# Patient Record
Sex: Female | Born: 1980 | Hispanic: Yes | Marital: Married | Smoking: Never smoker
Health system: Southern US, Community
[De-identification: ages and names within clinical notes are randomized; demographics above are authoritative.]

## PROBLEM LIST (undated history)

## (undated) HISTORY — PX: TUBAL LIGATION: SHX77

---

## 2009-01-13 ENCOUNTER — Emergency Department: Payer: Self-pay | Admitting: Emergency Medicine

## 2009-04-18 ENCOUNTER — Ambulatory Visit: Payer: Self-pay | Admitting: Internal Medicine

## 2009-05-01 ENCOUNTER — Ambulatory Visit: Payer: Self-pay | Admitting: Internal Medicine

## 2018-07-19 ENCOUNTER — Emergency Department: Payer: Self-pay

## 2018-07-19 ENCOUNTER — Other Ambulatory Visit: Payer: Self-pay

## 2018-07-19 ENCOUNTER — Encounter: Payer: Self-pay | Admitting: Emergency Medicine

## 2018-07-19 ENCOUNTER — Emergency Department
Admission: EM | Admit: 2018-07-19 | Discharge: 2018-07-19 | Disposition: A | Discharge status: Home / Self Care | Payer: Self-pay | Attending: Emergency Medicine | Admitting: Emergency Medicine

## 2018-07-19 DIAGNOSIS — R109 Unspecified abdominal pain: Secondary | ICD-10-CM

## 2018-07-19 DIAGNOSIS — D219 Benign neoplasm of connective and other soft tissue, unspecified: Secondary | ICD-10-CM

## 2018-07-19 DIAGNOSIS — R1030 Lower abdominal pain, unspecified: Secondary | ICD-10-CM | POA: Insufficient documentation

## 2018-07-19 LAB — URINALYSIS, COMPLETE (UACMP) WITH MICROSCOPIC
BACTERIA UA: NONE SEEN
BILIRUBIN URINE: NEGATIVE
Glucose, UA: NEGATIVE mg/dL
KETONES UR: NEGATIVE mg/dL
LEUKOCYTES UA: NEGATIVE
Nitrite: NEGATIVE
PROTEIN: 100 mg/dL — AB
Specific Gravity, Urine: 1.018 (ref 1.005–1.030)
pH: 6 (ref 5.0–8.0)

## 2018-07-19 LAB — CBC
HEMATOCRIT: 37.4 % (ref 36.0–46.0)
Hemoglobin: 12.5 g/dL (ref 12.0–15.0)
MCH: 25.7 pg — AB (ref 26.0–34.0)
MCHC: 33.4 g/dL (ref 30.0–36.0)
MCV: 77 fL — AB (ref 80.0–100.0)
Platelets: 284 10*3/uL (ref 150–400)
RBC: 4.86 MIL/uL (ref 3.87–5.11)
RDW: 13.5 % (ref 11.5–15.5)
WBC: 9.5 10*3/uL (ref 4.0–10.5)
nRBC: 0 % (ref 0.0–0.2)

## 2018-07-19 LAB — COMPREHENSIVE METABOLIC PANEL
ALT: 59 U/L — AB (ref 0–44)
AST: 45 U/L — AB (ref 15–41)
Albumin: 4.3 g/dL (ref 3.5–5.0)
Alkaline Phosphatase: 83 U/L (ref 38–126)
Anion gap: 7 (ref 5–15)
BUN: 7 mg/dL (ref 6–20)
CHLORIDE: 104 mmol/L (ref 98–111)
CO2: 27 mmol/L (ref 22–32)
CREATININE: 0.4 mg/dL — AB (ref 0.44–1.00)
Calcium: 9.1 mg/dL (ref 8.9–10.3)
GFR calc Af Amer: >60 mL/min (ref >60)
GFR calc non Af Amer: >60 mL/min (ref >60)
GLUCOSE: 110 mg/dL — AB (ref 70–99)
Potassium: 3.8 mmol/L (ref 3.5–5.1)
SODIUM: 138 mmol/L (ref 135–145)
Total Bilirubin: 0.4 mg/dL (ref 0.3–1.2)
Total Protein: 7.4 g/dL (ref 6.5–8.1)

## 2018-07-19 LAB — POCT PREGNANCY, URINE: PREG TEST UR: NEGATIVE

## 2018-07-19 MED ORDER — OXYCODONE-ACETAMINOPHEN 5-325 MG PO TABS
1.0000 | ORAL_TABLET | ORAL | Status: DC | PRN
  Administered 2018-07-19: 1 via ORAL
  Filled 2018-07-19: qty 1

## 2018-07-19 MED ORDER — KETOROLAC TROMETHAMINE 30 MG/ML IJ SOLN
30.0000 mg | Freq: Once | INTRAMUSCULAR | Status: AC
  Administered 2018-07-19: 30 mg via INTRAMUSCULAR
  Filled 2018-07-19: qty 1

## 2018-07-19 NOTE — ED Provider Notes (Signed)
[facility] Emergency Department Provider Note  ____________________________________________   I have reviewed the triage vital signs and the nursing notes. Where available I have reviewed prior notes and, if possible and indicated, outside hospital notes.    HISTORY  Chief Complaint Abdominal Pain    HPI Carla Davenport is a 37 y.o. female  who presents today complaining of abdominal pain. Patient has had abdominal pain for 5 years. Worse with her menstrual period. She states she did see Dr. Oletta Darter about this and they one unable to tell her why she hasn't. She also complains of constipation. She denies any fever or chills. She is not sexually active she states she has no vaginal discharge. She has heavy menstrual periods, and when she has her periods are very painful. Not is why she is here. She's taken nothing for the pain except for Tylenol home. Nothing makes it better except for getting off her. Nothing makes it worse except for having her period. No fever no vomiting no diarrhea no other associated symptoms. She is not lightheaded. No trauma. Dysuria no urinary frequency. crampy lower abdominal tenderness with no radiation  History reviewed. No pertinent past medical history.  There are no active problems to display for this patient.   Past Surgical History:  Procedure Laterality Date  . CESAREAN SECTION    . TUBAL LIGATION      Prior to Admission medications   Not on File    Allergies Patient has no known allergies.  No family history on file.  Social History Social History   Tobacco Use  . Smoking status: Never Smoker  . Smokeless tobacco: Never Used  Substance Use Topics  . Alcohol use: Not Currently  . Drug use: Not on file    Review of Systems Constitutional: No fever/chills Eyes: No visual changes. ENT: No sore throat. No stiff neck no neck pain Cardiovascular: Denies chest pain. Respiratory: Denies shortness of  breath. Gastrointestinal:   no vomiting.  No diarrhea.  No constipation. Genitourinary: Negative for dysuria. Musculoskeletal: Negative lower extremity swelling Skin: Negative for rash. Neurological: Negative for severe headaches, focal weakness or numbness.   ____________________________________________   PHYSICAL EXAM:  VITAL SIGNS: ED Triage Vitals  Enc Vitals Group     BP 07/19/18 1507 (!) 169/87     Pulse Rate 07/19/18 1507 (!) 101     Resp 07/19/18 1507 18     Temp 07/19/18 1507 98.2 F (36.8 C)     Temp Source 07/19/18 1507 Oral     SpO2 07/19/18 1507 99 %     Weight 07/19/18 1513 190 lb (86.2 kg)     Height 07/19/18 1513 5\' 8"  (1.727 m)     Head Circumference --      Peak Flow --      Pain Score 07/19/18 1512 10     Pain Loc --      Pain Edu? --      Excl. in [place]? --     Constitutional: Alert and oriented. Well appearing and in no acute distress. Eyes: Conjunctivae are normal Head: Atraumatic HEENT: No congestion/rhinnorhea. Mucous membranes are moist.  Oropharynx non-erythematous Neck:   Nontender with no meningismus, no masses, no stridor Cardiovascular: Normal rate, regular rhythm. Grossly normal heart sounds.  Good peripheral circulation. Respiratory: Normal respiratory effort.  No retractions. Lungs CTAB. Abdominal: Soft and suprapubic tenderness noted. No distention. No guarding no rebound Back:  There is no focal tenderness or step off.  there  is no midline tenderness there are no lesions noted. there is no CVA tenderness  patient declines pelvic exam preferring to have ultrasound, understands limitations of declining Musculoskeletal: No lower extremity tenderness, no upper extremity tenderness. No joint effusions, no DVT signs strong distal pulses no edema Neurologic:  Normal speech and language. No gross focal neurologic deficits are appreciated.  Skin:  Skin is warm, dry and intact. No rash noted. Psychiatric: Mood and affect are normal. Speech and  behavior are normal.  ____________________________________________   LABS (all labs ordered are listed, but only abnormal results are displayed)  Labs Reviewed  COMPREHENSIVE METABOLIC PANEL - Abnormal; Notable for the following components:      Result Value   Glucose, Bld 110 (*)    Creatinine, Ser 0.40 (*)    AST 45 (*)    ALT 59 (*)    All other components within normal limits  CBC - Abnormal; Notable for the following components:   MCV 77.0 (*)    MCH 25.7 (*)    All other components within normal limits  URINALYSIS, COMPLETE (UACMP) WITH MICROSCOPIC - Abnormal; Notable for the following components:   Color, Urine YELLOW (*)    APPearance CLEAR (*)    Hgb urine dipstick LARGE (*)    Protein, ur 100 (*)    RBC / HPF >50 (*)    All other components within normal limits  POC URINE PREG, ED  POCT PREGNANCY, URINE    Pertinent labs  results that were available during my care of the patient were reviewed by me and considered in my medical decision making (see chart for details). ____________________________________________  EKG  I personally interpreted any EKGs ordered by me or triage  ____________________________________________  RADIOLOGY  Pertinent labs & imaging results that were available during my care of the patient were reviewed by me and considered in my medical decision making (see chart for details). If possible, patient and/or family made aware of any abnormal findings.  Dg Abdomen 1 View  Result Date: 07/19/2018 CLINICAL DATA:  Chronic abdominal pain for 5 years. EXAM: ABDOMEN - 1 VIEW COMPARISON:  None. FINDINGS: The bowel gas pattern is normal. No radio-opaque calculi or other significant radiographic abnormality are seen. IMPRESSION: Negative. Electronically Signed   By: Margarette Canada M.D.   On: 07/19/2018 19:42   ____________________________________________    PROCEDURES  Procedure(s) performed: None  Procedures  Critical Care performed:  None  ____________________________________________   INITIAL IMPRESSION / ASSESSMENT AND PLAN / ED COURSE  Pertinent labs & imaging results that were available during my care of the patient were reviewed by me and considered in my medical decision making (see chart for details).  with menstrual related chronic abdominal pain, abdomen is nonsurgical blood work is reassuring vitals are reassuring we will give her Toradol and 10 ultrasound given some issues with follow-up and we will reassess    ____________________________________________   FINAL CLINICAL IMPRESSION(S) / ED DIAGNOSES  Final diagnoses:  Abdominal pain      This chart was dictated using voice recognition software.  Despite best efforts to proofread,  errors can occur which can change meaning.      Schuyler Amor, MD 07/19/18 2034

## 2018-07-19 NOTE — ED Triage Notes (Signed)
Abdominal pain--on and off forr 5 years.  Pain is getting worse and is difficult to do basic things now.  This time since yesterday am.  Holding right lower abd.

## 2019-05-15 ENCOUNTER — Other Ambulatory Visit: Payer: Self-pay

## 2019-05-15 DIAGNOSIS — Z20822 Contact with and (suspected) exposure to covid-19: Secondary | ICD-10-CM

## 2019-05-16 ENCOUNTER — Telehealth: Payer: Self-pay | Admitting: *Deleted

## 2019-05-16 LAB — NOVEL CORONAVIRUS, NAA: SARS-CoV-2, NAA: DETECTED — AB

## 2019-05-16 NOTE — Telephone Encounter (Signed)
Pt called to get her covid-19 test result, using [facility] Interpreter # [phone]. Her test result was positive. She was advised that she should be in quarantine for 10 days and 3 plus days she should be afebrile without taking medication. She has symptoms of headache, cough and body aches. She is advised to stay hydrated, get rest, exercise and also wear a mask if she absolutely has to go outside. Everyone in the household needs to be tested also. To call 911 for respiratory distress. Advised that the health department will also be notified. She voiced understanding.

## 2019-10-02 IMAGING — CR DG ABDOMEN 1V
1 series · 1 of 1 positions shown · non-contrast
Comparison: None.

CLINICAL DATA: Chronic abdominal pain for 5 years.

EXAM:
ABDOMEN - 1 VIEW

[abdomen kub]
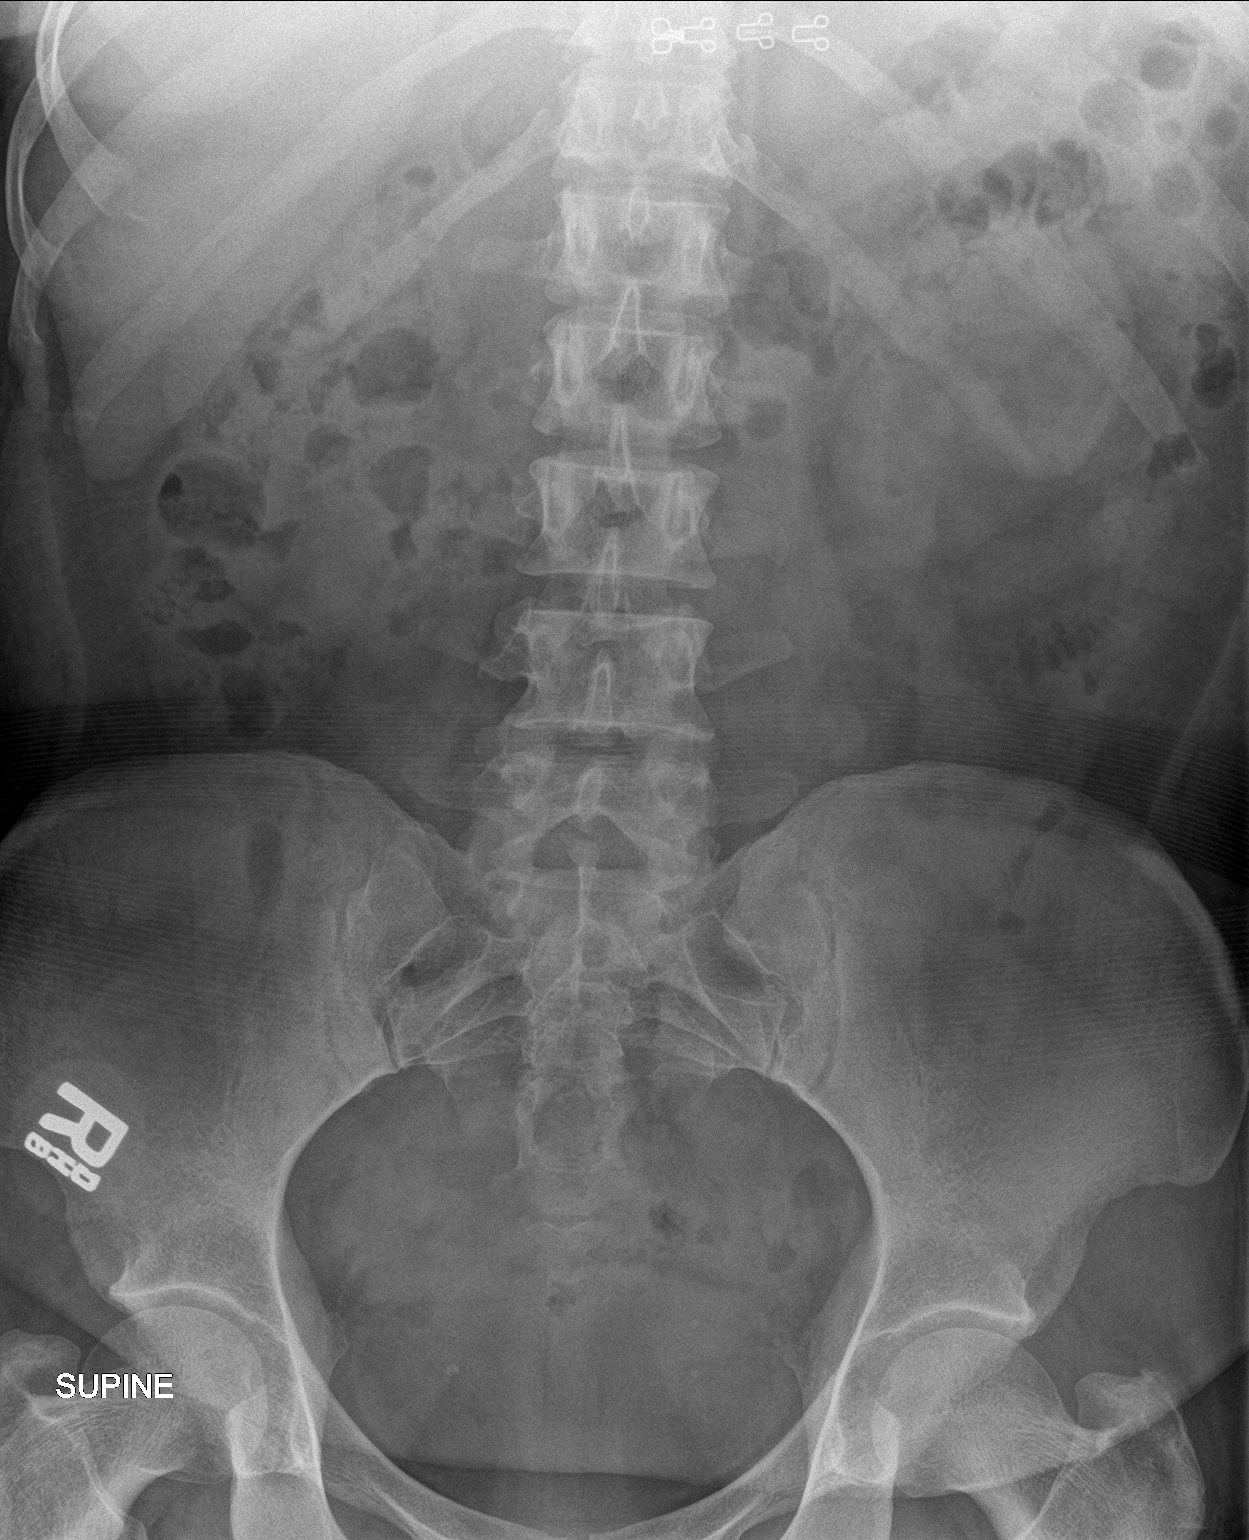

[1 of 1 positions shown; findings below may reference images not displayed]

FINDINGS: The bowel gas pattern is normal. No radio-opaque calculi or other
significant radiographic abnormality are seen.
IMPRESSION: Negative.

## 2020-03-31 ENCOUNTER — Other Ambulatory Visit: Payer: Self-pay

## 2020-03-31 ENCOUNTER — Encounter: Payer: Self-pay | Admitting: Emergency Medicine

## 2020-03-31 ENCOUNTER — Emergency Department: Payer: Self-pay

## 2020-03-31 ENCOUNTER — Emergency Department
Admission: EM | Admit: 2020-03-31 | Discharge: 2020-03-31 | Disposition: A | Discharge status: Home / Self Care | Payer: Self-pay | Attending: Emergency Medicine | Admitting: Emergency Medicine

## 2020-03-31 DIAGNOSIS — R109 Unspecified abdominal pain: Secondary | ICD-10-CM

## 2020-03-31 DIAGNOSIS — R1031 Right lower quadrant pain: Secondary | ICD-10-CM | POA: Insufficient documentation

## 2020-03-31 LAB — BASIC METABOLIC PANEL
Anion gap: 9 (ref 5–15)
BUN: 7 mg/dL (ref 6–20)
CO2: 27 mmol/L (ref 22–32)
Calcium: 9.1 mg/dL (ref 8.9–10.3)
Chloride: 102 mmol/L (ref 98–111)
Creatinine, Ser: 0.42 mg/dL — ABNORMAL LOW (ref 0.44–1.00)
GFR calc Af Amer: >60 mL/min (ref >60)
GFR calc non Af Amer: >60 mL/min (ref >60)
Glucose, Bld: 99 mg/dL (ref 70–99)
Potassium: 3.6 mmol/L (ref 3.5–5.1)
Sodium: 138 mmol/L (ref 135–145)

## 2020-03-31 LAB — URINALYSIS, COMPLETE (UACMP) WITH MICROSCOPIC
Bilirubin Urine: NEGATIVE
Glucose, UA: 50 mg/dL — AB
Ketones, ur: NEGATIVE mg/dL
Leukocytes,Ua: NEGATIVE
Nitrite: NEGATIVE
Protein, ur: NEGATIVE mg/dL
Specific Gravity, Urine: 1.008 (ref 1.005–1.030)
pH: 6 (ref 5.0–8.0)

## 2020-03-31 LAB — HEPATIC FUNCTION PANEL
ALT: 67 U/L — ABNORMAL HIGH (ref 0–44)
AST: 76 U/L — ABNORMAL HIGH (ref 15–41)
Albumin: 4.1 g/dL (ref 3.5–5.0)
Alkaline Phosphatase: 72 U/L (ref 38–126)
Bilirubin, Direct: 0.1 mg/dL (ref 0.0–0.2)
Indirect Bilirubin: 0.7 mg/dL (ref 0.3–0.9)
Total Bilirubin: 0.8 mg/dL (ref 0.3–1.2)
Total Protein: 7.5 g/dL (ref 6.5–8.1)

## 2020-03-31 LAB — CBC
HCT: 35.9 % — ABNORMAL LOW (ref 36.0–46.0)
Hemoglobin: 12.1 g/dL (ref 12.0–15.0)
MCH: 25.5 pg — ABNORMAL LOW (ref 26.0–34.0)
MCHC: 33.7 g/dL (ref 30.0–36.0)
MCV: 75.7 fL — ABNORMAL LOW (ref 80.0–100.0)
Platelets: 255 10*3/uL (ref 150–400)
RBC: 4.74 MIL/uL (ref 3.87–5.11)
RDW: 13.5 % (ref 11.5–15.5)
WBC: 8.5 10*3/uL (ref 4.0–10.5)
nRBC: 0 % (ref 0.0–0.2)

## 2020-03-31 LAB — LIPASE, BLOOD: Lipase: 41 U/L (ref 11–51)

## 2020-03-31 LAB — POCT PREGNANCY, URINE: Preg Test, Ur: NEGATIVE

## 2020-03-31 MED ORDER — HYDROCODONE-ACETAMINOPHEN 5-325 MG PO TABS: 1.0000 | ORAL_TABLET | ORAL | 0 refills | Status: AC | PRN

## 2020-03-31 MED ORDER — HYDROCODONE-ACETAMINOPHEN 5-325 MG PO TABS
1.0000 | ORAL_TABLET | Freq: Once | ORAL | Status: AC
  Administered 2020-03-31: 1 via ORAL
  Filled 2020-03-31: qty 1

## 2020-03-31 NOTE — ED Provider Notes (Signed)
[facility] Emergency Department Provider Note  Time seen: 5:17 PM  I have reviewed the triage vital signs and the nursing notes.   HISTORY  Chief Complaint Flank Pain   HPI Carla Davenport is a 39 y.o. female with no past medical history presents to the emergency department for right flank pain. According to the patient for the last month she has been experiencing intermittent right flank to right lower quadrant pain. Patient denies any fever, hematuria, vaginal bleeding or discharge. Last menstrual period ended approximately 3 days ago. Denies any fever denies any vomiting or diarrhea but does state occasional nausea.   History reviewed. No pertinent past medical history.  There are no problems to display for this patient.   Past Surgical History:  Procedure Laterality Date  . CESAREAN SECTION    . TUBAL LIGATION      Prior to Admission medications   Not on File    No Known Allergies  History reviewed. No pertinent family history.  Social History Social History   Tobacco Use  . Smoking status: Never Smoker  . Smokeless tobacco: Never Used  Vaping Use  . Vaping Use: Never used  Substance Use Topics  . Alcohol use: Not Currently  . Drug use: Never    Review of Systems Constitutional: Negative for fever. Cardiovascular: Negative for chest pain. Respiratory: Negative for shortness of breath. Gastrointestinal: Right flank pain. Positive nausea. Negative for vomiting or diarrhea Genitourinary: Negative for urinary compaints Musculoskeletal: Negative for musculoskeletal complaints Neurological: Negative for headache All other ROS negative  ____________________________________________   PHYSICAL EXAM:  VITAL SIGNS: ED Triage Vitals  Enc Vitals Group     BP 03/31/20 1525 (!) 141/94     Pulse Rate 03/31/20 1525 74     Resp 03/31/20 1525 17     Temp 03/31/20 1525 98.2 F (36.8 C)     Temp Source 03/31/20 1525 Oral     SpO2 03/31/20  1525 99 %     Weight 03/31/20 1525 187 lb (84.8 kg)     Height 03/31/20 1535 5\' 6"  (1.676 m)     Head Circumference --      Peak Flow --      Pain Score 03/31/20 1534 6     Pain Loc --      Pain Edu? --      Excl. in [place]? --    Constitutional: Alert and oriented. Well appearing and in no distress. Eyes: Normal exam ENT      Head: Normocephalic and atraumatic.      Mouth/Throat: Mucous membranes are moist. Cardiovascular: Normal rate, regular rhythm.  Respiratory: Normal respiratory effort without tachypnea nor retractions. Breath sounds are clear  Gastrointestinal: Soft and nontender. No distention.   Musculoskeletal: Nontender with normal range of motion in all extremities.  Neurologic:  No gross focal neurologic deficits  Skin:  Skin is warm, dry and intact.  Psychiatric: Mood and affect are normal.  ____________________________________________   RADIOLOGY   IMPRESSION:  Mild right-sided hydronephrosis and hydroureter, without evidence of  obstructing renal stones.   ____________________________________________   INITIAL IMPRESSION / ASSESSMENT AND PLAN / ED COURSE  Pertinent labs & imaging results that were available during my care of the patient were reviewed by me and considered in my medical decision making (see chart for details).   Patient presents emergency department for right flank/right lower quadrant abdominal pain. Symptoms have been ongoing but intermittent over the past 1 month. Lab work is  largely reassuring. We'll obtain a CT renal scan to further evaluate help rule out ureterolithiasis evaluate for colonic burden, colitis, diverticulitis. Patient states a history of fibroids. Patient currently rates her pain as a 5/10. We'll dose 1 Norco tablet while awaiting CT imaging. Patient agreeable to plan of care.  CT scan shows right-sided hydronephrosis with hydroureter but no obvious kidney stone.  Patient could have had a recently passed kidney stone which would  explain the hematuria as well as CT findings.  We will discharge patient with a short course of pain medication and urology follow-up.  Remainder the patient's work-up has been nonrevealing.  Slight LFT elevation with no right upper quadrant tenderness.  Not significantly changed from 1 year ago.  Patient agreeable to plan of care.  Carla Davenport was evaluated in Emergency Department on 03/31/2020 for the symptoms described in the history of present illness. She was evaluated in the context of the global COVID-19 pandemic, which necessitated consideration that the patient might be at risk for infection with the SARS-CoV-2 virus that causes COVID-19. Institutional protocols and algorithms that pertain to the evaluation of patients at risk for COVID-19 are in a state of rapid change based on information released by regulatory bodies including the CDC and federal and state organizations. These policies and algorithms were followed during the patient's care in the ED.  ____________________________________________   FINAL CLINICAL IMPRESSION(S) / ED DIAGNOSES  Right flank pain   Harvest Dark, MD 03/31/20 6886

## 2020-03-31 NOTE — ED Notes (Signed)
Pt discharged during Epic downtime. Could not put in updated VS or obtain e-signature

## 2020-03-31 NOTE — ED Triage Notes (Signed)
Via interpreter: pt has had right flank pain x 1 month. STates she has more pain when urinating, denies frequency, endorses urgency. Pt states pain worse last night and today. Pt also reports that she has some constipation. Endorses nausea. NAD noted.

## 2021-06-14 IMAGING — CT CT RENAL STONE PROTOCOL
3 of 4 series · 10 of 46 positions shown, 17 images · non-contrast
Comparison: None.

CLINICAL DATA: Right flank pain x1 month.

EXAM:
CT ABDOMEN AND PELVIS WITHOUT CONTRAST
TECHNIQUE: Multidetector CT imaging of the abdomen and pelvis was performed
following the standard protocol without IV contrast.

[Series 4: lung bases · axial · 0.74mm/px · z∈[-178,-53]mm · 6 of 35 slices shown, 11 images]
[im 5/35  soft-tissue]
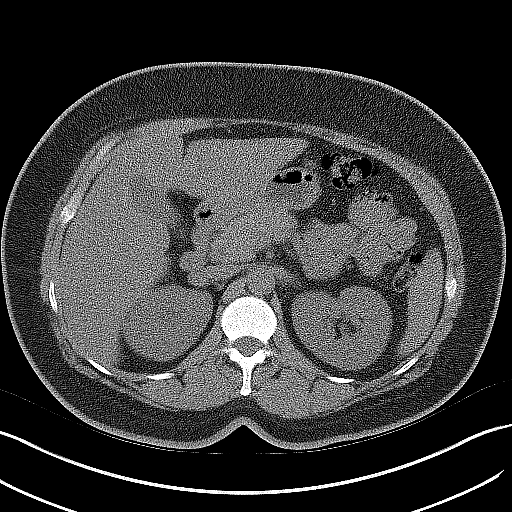
[im 5/35  bone]
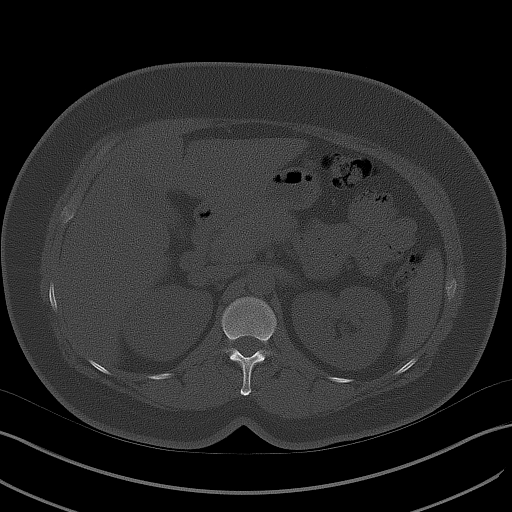
[im 10/35  soft-tissue]
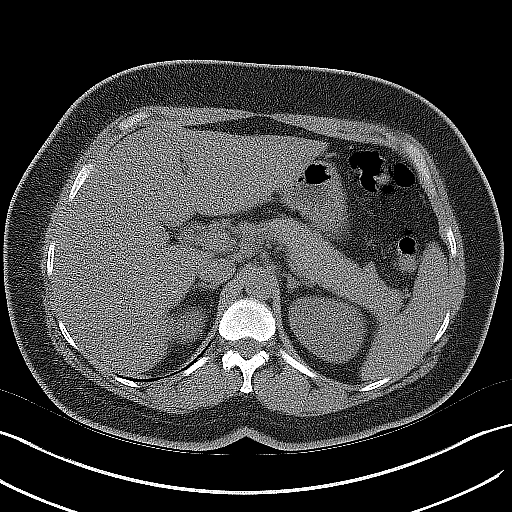
[im 15/35  soft-tissue]
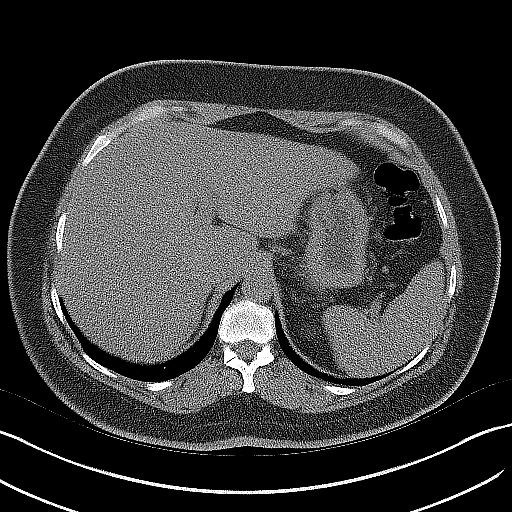
[im 15/35  lung]
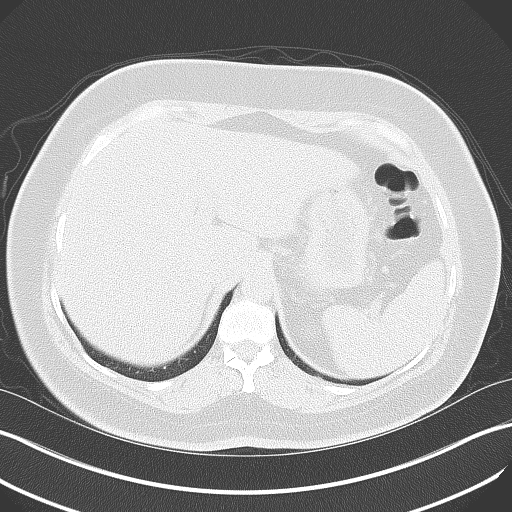
[im 20/35  soft-tissue]
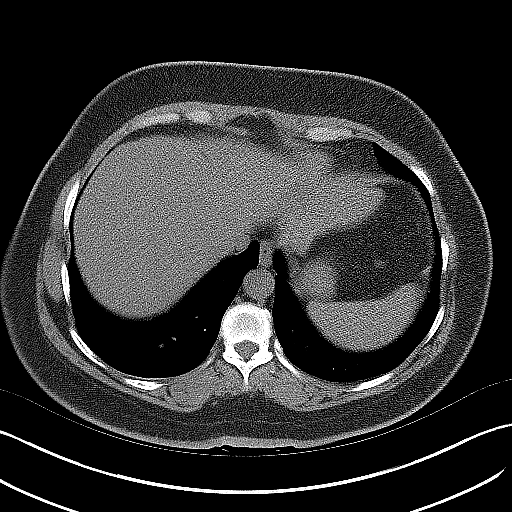
[im 20/35  lung]
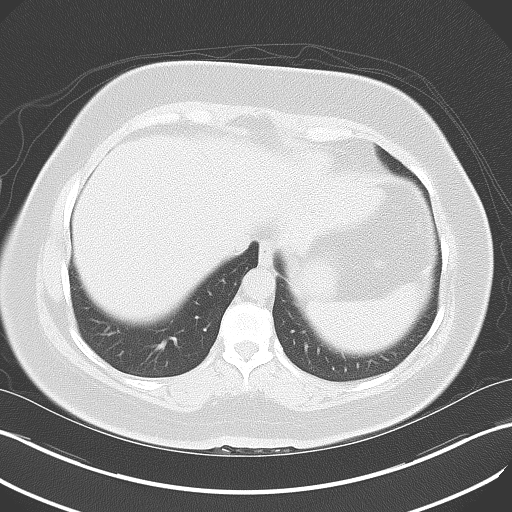
[im 25/35  soft-tissue]
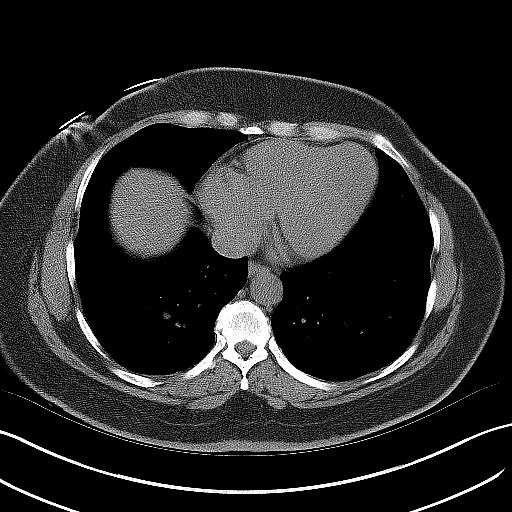
[im 25/35  lung]
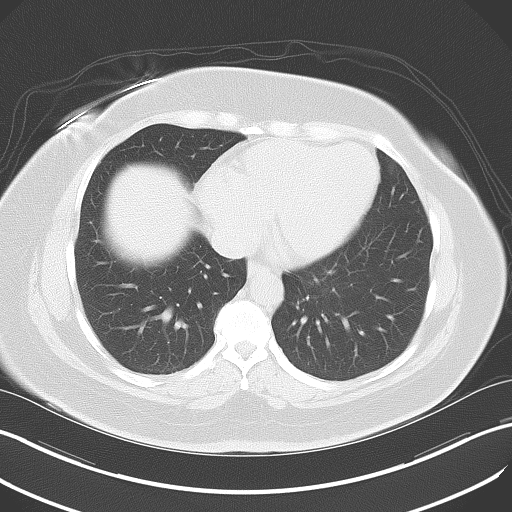
[im 30/35  soft-tissue]
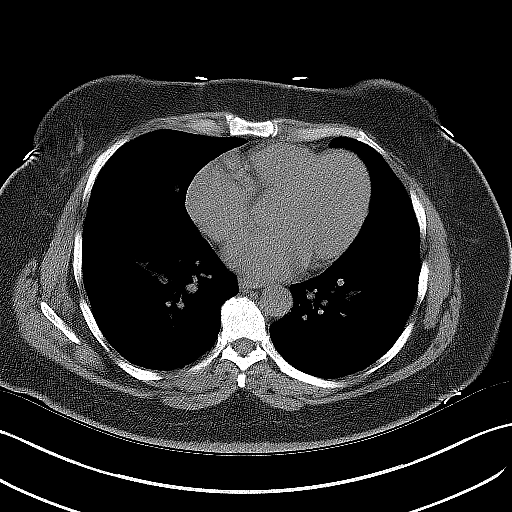
[im 30/35  lung]
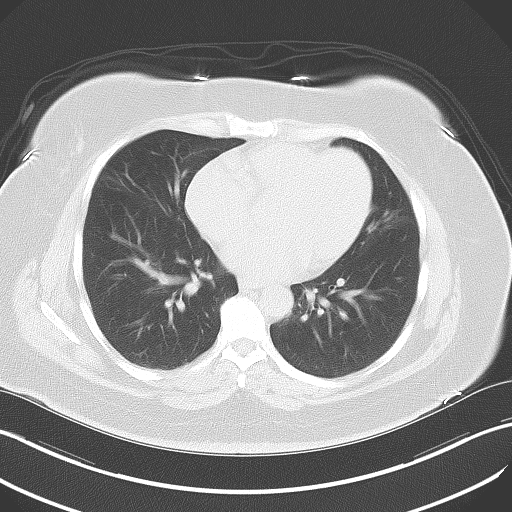

[Series 5: coronal · coronal · 0.85mm/px · 3 of 136 slices shown, 4 images]
[im 46/136  soft-tissue]
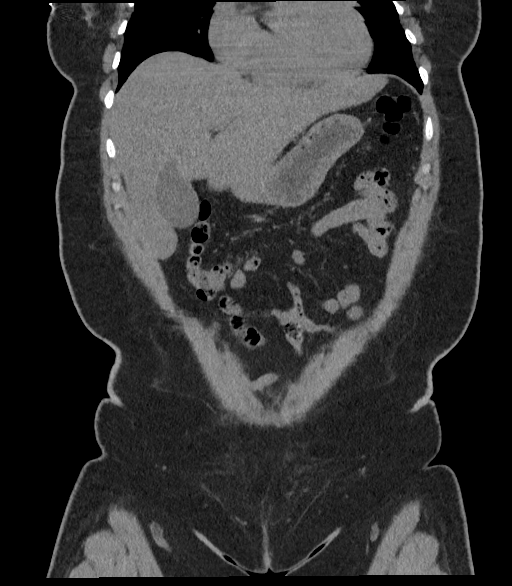
[im 61/136  soft-tissue]
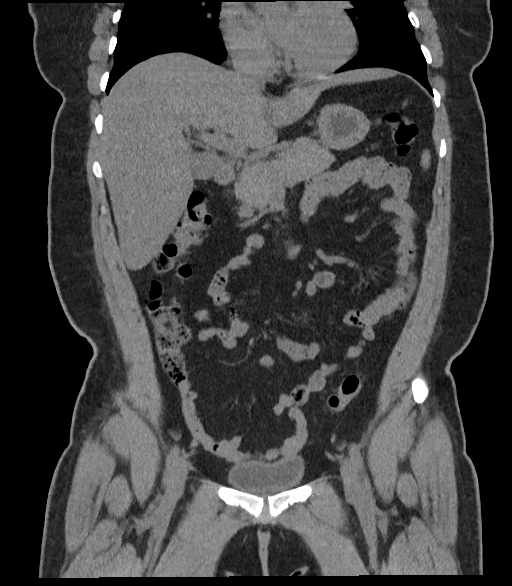
[im 61/136  bone]
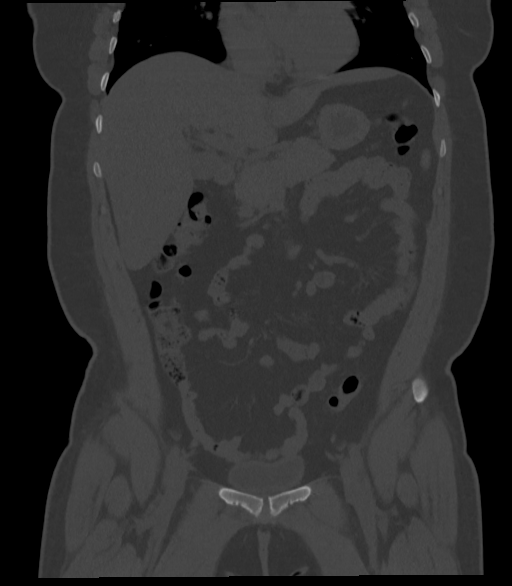
[im 76/136  soft-tissue]
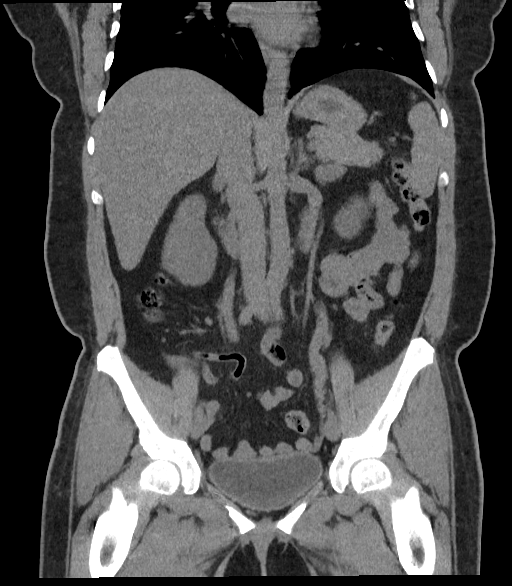

[Series 6: sagittal · sagittal · 0.62mm/px · 1 of 186 slices shown, 2 images]
[im 62/186  soft-tissue]
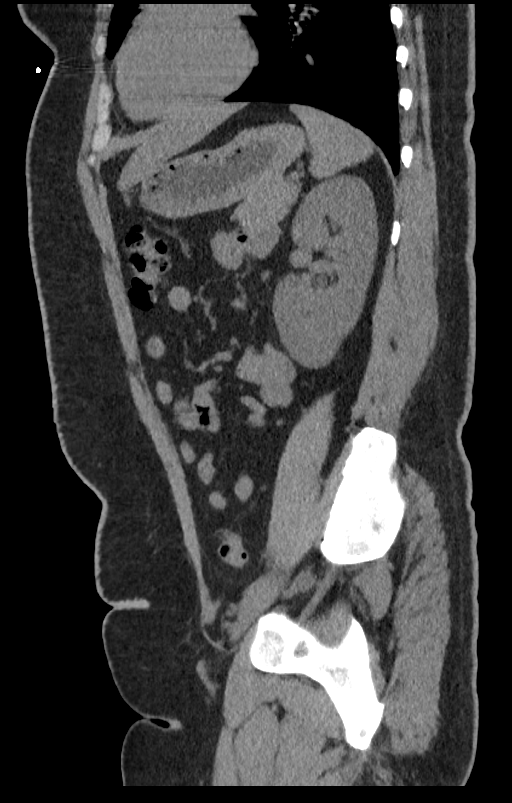
[im 62/186  bone]
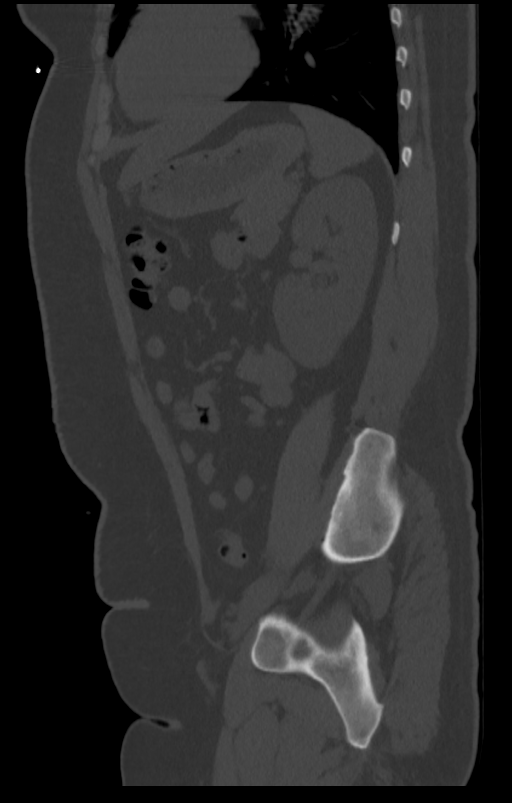

[10 of 46 positions shown; findings below may reference images not displayed]

FINDINGS: Lower chest: No acute abnormality.

Hepatobiliary: No focal liver abnormality is seen. No gallstones,
gallbladder wall thickening, or biliary dilatation.

Pancreas: Unremarkable. No pancreatic ductal dilatation or
surrounding inflammatory changes.

Spleen: Normal in size without focal abnormality.

Adrenals/Urinary Tract: Adrenal glands are unremarkable. Kidneys are
normal in size without focal lesions. There is mild right-sided
hydronephrosis and hydroureter, without evidence of obstructing
renal stones. Bladder is unremarkable.

Stomach/Bowel: Stomach is within normal limits. Appendix appears
normal. No evidence of bowel wall thickening, distention, or
inflammatory changes.

Vascular/Lymphatic: No significant vascular findings are present. No
enlarged abdominal or pelvic lymph nodes.

Reproductive: The uterus is mildly enlarged and heterogeneous in
appearance. The bilateral adnexa are unremarkable.

Other: No abdominal wall hernia or abnormality. No abdominopelvic
ascites.

Musculoskeletal: No acute or significant osseous findings.
IMPRESSION: Mild right-sided hydronephrosis and hydroureter, without evidence of
obstructing renal stones.
